# Patient Record
Sex: Male | Born: 1950 | Race: Black or African American | Hispanic: No | Marital: Single
Health system: Southern US, Community
[De-identification: ages and names within clinical notes are randomized; demographics above are authoritative.]

---

## 2010-02-20 ENCOUNTER — Emergency Department (HOSPITAL_COMMUNITY)
Admission: EM | Admit: 2010-02-20 | Discharge: 2010-02-20 | Payer: Self-pay | Source: Home / Self Care | Admitting: Emergency Medicine
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
## 2020-12-02 ENCOUNTER — Emergency Department (HOSPITAL_COMMUNITY): Payer: No Typology Code available for payment source

## 2020-12-02 ENCOUNTER — Other Ambulatory Visit: Payer: Self-pay

## 2020-12-02 ENCOUNTER — Emergency Department (HOSPITAL_COMMUNITY)
Admission: EM | Admit: 2020-12-02 | Discharge: 2020-12-02 | Disposition: A | Discharge status: Home / Self Care | Payer: No Typology Code available for payment source | Attending: Emergency Medicine | Admitting: Emergency Medicine

## 2020-12-02 DIAGNOSIS — R1012 Left upper quadrant pain: Secondary | ICD-10-CM | POA: Insufficient documentation

## 2020-12-02 DIAGNOSIS — Z951 Presence of aortocoronary bypass graft: Secondary | ICD-10-CM | POA: Insufficient documentation

## 2020-12-02 DIAGNOSIS — R0602 Shortness of breath: Secondary | ICD-10-CM | POA: Diagnosis not present

## 2020-12-02 LAB — COMPREHENSIVE METABOLIC PANEL
ALT: 14 U/L (ref 0–44)
AST: 23 U/L (ref 15–41)
Albumin: 2.7 g/dL — ABNORMAL LOW (ref 3.5–5.0)
Alkaline Phosphatase: 63 U/L (ref 38–126)
Anion gap: 7 (ref 5–15)
BUN: 10 mg/dL (ref 8–23)
CO2: 21 mmol/L — ABNORMAL LOW (ref 22–32)
Calcium: 8 mg/dL — ABNORMAL LOW (ref 8.9–10.3)
Chloride: 104 mmol/L (ref 98–111)
Creatinine, Ser: 0.68 mg/dL (ref 0.61–1.24)
GFR, Estimated: >60 mL/min (ref >60)
Glucose, Bld: 173 mg/dL — ABNORMAL HIGH (ref 70–99)
Potassium: 4.1 mmol/L (ref 3.5–5.1)
Sodium: 132 mmol/L — ABNORMAL LOW (ref 135–145)
Total Bilirubin: 0.5 mg/dL (ref 0.3–1.2)
Total Protein: 5.8 g/dL — ABNORMAL LOW (ref 6.5–8.1)

## 2020-12-02 LAB — CBC WITH DIFFERENTIAL/PLATELET
Abs Immature Granulocytes: 0.11 10*3/uL — ABNORMAL HIGH (ref 0.00–0.07)
Basophils Absolute: 0.1 10*3/uL (ref 0.0–0.1)
Basophils Relative: 1 %
Eosinophils Absolute: 0.1 10*3/uL (ref 0.0–0.5)
Eosinophils Relative: 1 %
HCT: 30.4 % — ABNORMAL LOW (ref 39.0–52.0)
Hemoglobin: 9.8 g/dL — ABNORMAL LOW (ref 13.0–17.0)
Immature Granulocytes: 2 %
Lymphocytes Relative: 30 %
Lymphs Abs: 2.2 10*3/uL (ref 0.7–4.0)
MCH: 28.8 pg (ref 26.0–34.0)
MCHC: 32.2 g/dL (ref 30.0–36.0)
MCV: 89.4 fL (ref 80.0–100.0)
Monocytes Absolute: 0.8 10*3/uL (ref 0.1–1.0)
Monocytes Relative: 11 %
Neutro Abs: 4 10*3/uL (ref 1.7–7.7)
Neutrophils Relative %: 55 %
Platelets: 147 10*3/uL — ABNORMAL LOW (ref 150–400)
RBC: 3.4 MIL/uL — ABNORMAL LOW (ref 4.22–5.81)
RDW: 13.3 % (ref 11.5–15.5)
WBC: 7.2 10*3/uL (ref 4.0–10.5)
nRBC: 0 % (ref 0.0–0.2)

## 2020-12-02 LAB — TYPE AND SCREEN
ABO/RH(D): O POS
Antibody Screen: NEGATIVE

## 2020-12-02 LAB — I-STAT CHEM 8, ED
BUN: 10 mg/dL (ref 8–23)
Calcium, Ion: 1.07 mmol/L — ABNORMAL LOW (ref 1.15–1.40)
Chloride: 102 mmol/L (ref 98–111)
Creatinine, Ser: 0.6 mg/dL — ABNORMAL LOW (ref 0.61–1.24)
Glucose, Bld: 168 mg/dL — ABNORMAL HIGH (ref 70–99)
HCT: 31 % — ABNORMAL LOW (ref 39.0–52.0)
Hemoglobin: 10.5 g/dL — ABNORMAL LOW (ref 13.0–17.0)
Potassium: 4.2 mmol/L (ref 3.5–5.1)
Sodium: 135 mmol/L (ref 135–145)
TCO2: 25 mmol/L (ref 22–32)

## 2020-12-02 LAB — TROPONIN I (HIGH SENSITIVITY): Troponin I (High Sensitivity): 61 ng/L — ABNORMAL HIGH (ref <18)

## 2020-12-02 NOTE — ED Notes (Signed)
Pt. Ambulated with pulse ox and was able to maintain a reading greater than or equal to 98%. No SOB reported. Notified Madilyn Hook MD.

## 2020-12-02 NOTE — ED Provider Notes (Signed)
[facility] EMERGENCY DEPARTMENT Provider Note   CSN: [id number] Arrival date & time: 12/02/20  5102     History Chief Complaint  Patient presents with   Shortness of Breath    Glenn James is a 70 y.o. male.  The history is provided by the patient, the EMS personnel and medical records.  Shortness of Breath Glenn James is a 70 y.o. male who presents to the Emergency Department complaining of short of breath. He presents the emergency department by EMS for evaluation of shortness of breath. He is status post CABG at [facility]. He was discharged home on Friday. He states that he was doing well with his exercises following hospital discharge. He states that around 10 PM last night he abruptly became short of breath with left upper quadrant abdominal pain. At the time he was laying flat in bed. He attempted to adjust the pillows to see if this would help him feel better and it did not. He is compliant with his medication since hospital discharge. No fevers, nausea, vomiting. He is having clear watery stools. He states that he has not eaten anything since the procedure. No prior similar symptoms. No reports of bleeding.    No past medical history on file. Seizure CAD CABG CHF There are no problems to display for this patient.        No family history on file.     Home Medications Prior to Admission medications   Not on File    Allergies    Depakote [divalproex sodium] and Metformin and related  Review of Systems   Review of Systems  Respiratory:  Positive for shortness of breath.   All other systems reviewed and are negative.  Physical Exam Updated Vital Signs BP 139/88   Pulse (!) 101   Temp 98.1 F (36.7 C) (Oral)   Resp (!) 25   Ht 5\' 11"  (1.803 m)   Wt 73.5 kg   SpO2 98%   BMI 22.59 kg/m   Physical Exam Vitals and nursing note reviewed.  Constitutional:      Appearance: He is well-developed.  HENT:     Head:  Normocephalic and atraumatic.  Cardiovascular:     Rate and Rhythm: Normal rate and regular rhythm.     Heart sounds: No murmur heard. Pulmonary:     Effort: Pulmonary effort is normal. No respiratory distress.     Breath sounds: Normal breath sounds.     Comments: Midline surgical incision site is clean, dry and intact. Abdominal:     Palpations: Abdomen is soft.     Tenderness: There is no guarding or rebound.     Comments: Mild left upper quadrant tenderness  Musculoskeletal:        General: No swelling or tenderness.  Skin:    General: Skin is warm and dry.  Neurological:     Mental Status: He is alert and oriented to person, place, and time.  Psychiatric:        Behavior: Behavior normal.    ED Results / Procedures / Treatments   Labs (all labs ordered are listed, but only abnormal results are displayed) Labs Reviewed  COMPREHENSIVE METABOLIC PANEL - Abnormal; Notable for the following components:      Result Value   Sodium 132 (*)    CO2 21 (*)    Glucose, Bld 173 (*)    Calcium 8.0 (*)    Total Protein 5.8 (*)    Albumin 2.7 (*)  All other components within normal limits  CBC WITH DIFFERENTIAL/PLATELET - Abnormal; Notable for the following components:   RBC 3.40 (*)    Hemoglobin 9.8 (*)    HCT 30.4 (*)    Platelets 147 (*)    Abs Immature Granulocytes 0.11 (*)    All other components within normal limits  I-STAT CHEM 8, ED - Abnormal; Notable for the following components:   Creatinine, Ser 0.60 (*)    Glucose, Bld 168 (*)    Calcium, Ion 1.07 (*)    Hemoglobin 10.5 (*)    HCT 31.0 (*)    All other components within normal limits  TROPONIN I (HIGH SENSITIVITY) - Abnormal; Notable for the following components:   Troponin I (High Sensitivity) 61 (*)    All other components within normal limits  TYPE AND SCREEN  ABO/RH    EKG EKG Interpretation  Date/Time:  Sunday December 02 2020 05:43:13 EDT Ventricular Rate:  92 PR Interval:  161 QRS  Duration: 128 QT Interval:  397 QTC Calculation: 492 R Axis:   95 Text Interpretation: Sinus rhythm Left bundle branch block Confirmed by Tilden Fossa [phone]) on 12/02/2020 6:08:46 AM  Radiology DG Chest Port 1 View  Result Date: 12/02/2020 CLINICAL DATA:  Short of breath. EXAM: PORTABLE CHEST 1 VIEW COMPARISON:  None. FINDINGS: Previous median sternotomy and CABG procedure. Decreased lung volumes there are bilateral pleural effusions with veil like opacification of the lower lung zones. Subsegmental atelectasis suspected within both lung bases. IMPRESSION: 1. Bilateral pleural effusions. 2. Suspect bibasilar atelectasis. Electronically Signed   By: Signa Kell M.D.   On: 12/02/2020 06:36    Procedures Procedures   Medications Ordered in ED Medications - No data to display  ED Course  I have reviewed the triage vital signs and the nursing notes.  Pertinent labs & imaging results that were available during my care of the patient were reviewed by me and considered in my medical decision making (see chart for details).    MDM Rules/Calculators/A&P                          Pt here for evaluation of sob that started overnight.  He is s/p CABG on 9/14 at [facility].  Records reviewed in Epic.  D/c Hgb 10.  CXR with bilateral pleural effusions and EKG with LVH, intraventricular conduction delay.  On assessment patient states his SOB has resolved, has mild LUQ discomfort.  Troponin is mildly elevated at 61, which is expected following open heart surgery.  No evidence of major hemorrhage or decompensated CHF.  No evidence of post op infection.  Doubt PE.  Pt is able to ambulate without sob or hypoxia in the department.  Plan to d/c home with outpatient follow up and return precautions.     Final Clinical Impression(s) / ED Diagnoses Final diagnoses:  Shortness of breath    Rx / DC Orders ED Discharge Orders     None        Tilden Fossa, MD 12/02/20 [phone]

## 2020-12-02 NOTE — ED Notes (Signed)
Patient's cardiac care is done at [facility].

## 2020-12-02 NOTE — Discharge Instructions (Addendum)
Please call your surgeon and family doctor to let them know you were seen in the Emergency Department.  Continue your current medications as prescribed.   Get rechecked if you develop new or worsening symptoms.

## 2020-12-02 NOTE — ED Triage Notes (Signed)
Pt brought in by EMS for shortness of breath and chest pain. Pt had recent quadruple bypass heart surgery recently.

## 2020-12-02 NOTE — ED Notes (Signed)
MR have been requested from [facility].

## 2021-02-06 ENCOUNTER — Emergency Department (HOSPITAL_COMMUNITY)
Admission: EM | Admit: 2021-02-06 | Discharge: 2021-02-06 | Disposition: A | Discharge status: Home / Self Care | Payer: No Typology Code available for payment source | Attending: Emergency Medicine | Admitting: Emergency Medicine

## 2021-02-06 ENCOUNTER — Emergency Department (HOSPITAL_COMMUNITY): Payer: No Typology Code available for payment source

## 2021-02-06 ENCOUNTER — Other Ambulatory Visit: Payer: Self-pay

## 2021-02-06 DIAGNOSIS — R0602 Shortness of breath: Secondary | ICD-10-CM | POA: Insufficient documentation

## 2021-02-06 DIAGNOSIS — Z5321 Procedure and treatment not carried out due to patient leaving prior to being seen by health care provider: Secondary | ICD-10-CM | POA: Insufficient documentation

## 2021-02-06 DIAGNOSIS — R110 Nausea: Secondary | ICD-10-CM | POA: Diagnosis not present

## 2021-02-06 DIAGNOSIS — R2243 Localized swelling, mass and lump, lower limb, bilateral: Secondary | ICD-10-CM | POA: Diagnosis not present

## 2021-02-06 LAB — COMPREHENSIVE METABOLIC PANEL
ALT: 10 U/L (ref 0–44)
AST: 18 U/L (ref 15–41)
Albumin: 3.2 g/dL — ABNORMAL LOW (ref 3.5–5.0)
Alkaline Phosphatase: 90 U/L (ref 38–126)
Anion gap: 13 (ref 5–15)
BUN: 13 mg/dL (ref 8–23)
CO2: 20 mmol/L — ABNORMAL LOW (ref 22–32)
Calcium: 8.8 mg/dL — ABNORMAL LOW (ref 8.9–10.3)
Chloride: 102 mmol/L (ref 98–111)
Creatinine, Ser: 0.83 mg/dL (ref 0.61–1.24)
GFR, Estimated: >60 mL/min (ref >60)
Glucose, Bld: 119 mg/dL — ABNORMAL HIGH (ref 70–99)
Potassium: 3.8 mmol/L (ref 3.5–5.1)
Sodium: 135 mmol/L (ref 135–145)
Total Bilirubin: 1 mg/dL (ref 0.3–1.2)
Total Protein: 6.7 g/dL (ref 6.5–8.1)

## 2021-02-06 LAB — TROPONIN I (HIGH SENSITIVITY): Troponin I (High Sensitivity): 31 ng/L — ABNORMAL HIGH (ref <18)

## 2021-02-06 LAB — LIPASE, BLOOD: Lipase: 19 U/L (ref 11–51)

## 2021-02-06 NOTE — ED Provider Notes (Signed)
Emergency Medicine Provider Triage Evaluation Note  Glenn James , a 70 y.o. male  was evaluated in triage.  Pt complains of CABG x4 in September.  Since then he has been had more more shortness of breath than normal.  Since then see me for swelling in his ankles bilaterally and has had some increase shortness of breath.  The patient reports he has some nausea, increased shortness of breath, and generalized abdominal pain whenever he lays down.  Has dyspnea on exertion feels like he needs to catch his breath.  Denies any chest pain.  HR104 BP 118/84 CBG 104  Review of Systems  Positive: SOB, nausea, abdominal pain Negative: Chest pain, weakness  Physical Exam  There were no vitals taken for this visit. Gen:   Awake, no distress,  Resp:  Normal effort, speaking full sentences but needs to take breaths in between. Is on 2L for comfort.  MSK:   Moves extremities without difficulty, bilateral pitting edema to lower extremities. Other:  No acute distress.  Patient has pulses intact bilaterally in upper and lower extremities.  Abdomen soft with generalized tenderness.  Medical Decision Making  Medically screening exam initiated at 9:33 AM.  Appropriate orders placed.  Hershel Corkery was informed that the remainder of the evaluation will be completed by another provider, this initial triage assessment does not replace that evaluation, and the importance of remaining in the ED until their evaluation is complete.  Labs and imagining placed.    Achille Rich, PA-C 02/06/21 [id number]    Blane Ohara, MD 02/07/21 1003

## 2021-02-06 NOTE — ED Notes (Signed)
Patient leaving stated going to another place.

## 2021-02-06 NOTE — ED Triage Notes (Signed)
Per [facility] pt reports shortness of breath, nausea when laying down and bilateral ankle swelling since thanksgiving. No relief from Zofran this am.

## 2022-12-23 IMAGING — CR DG CHEST 2V
2 series · 2 of 2 positions shown · non-contrast
Comparison: Radiograph 12/02/2020

CLINICAL DATA: Shortness of breath

EXAM:
CHEST - 2 VIEW

[chest pa]
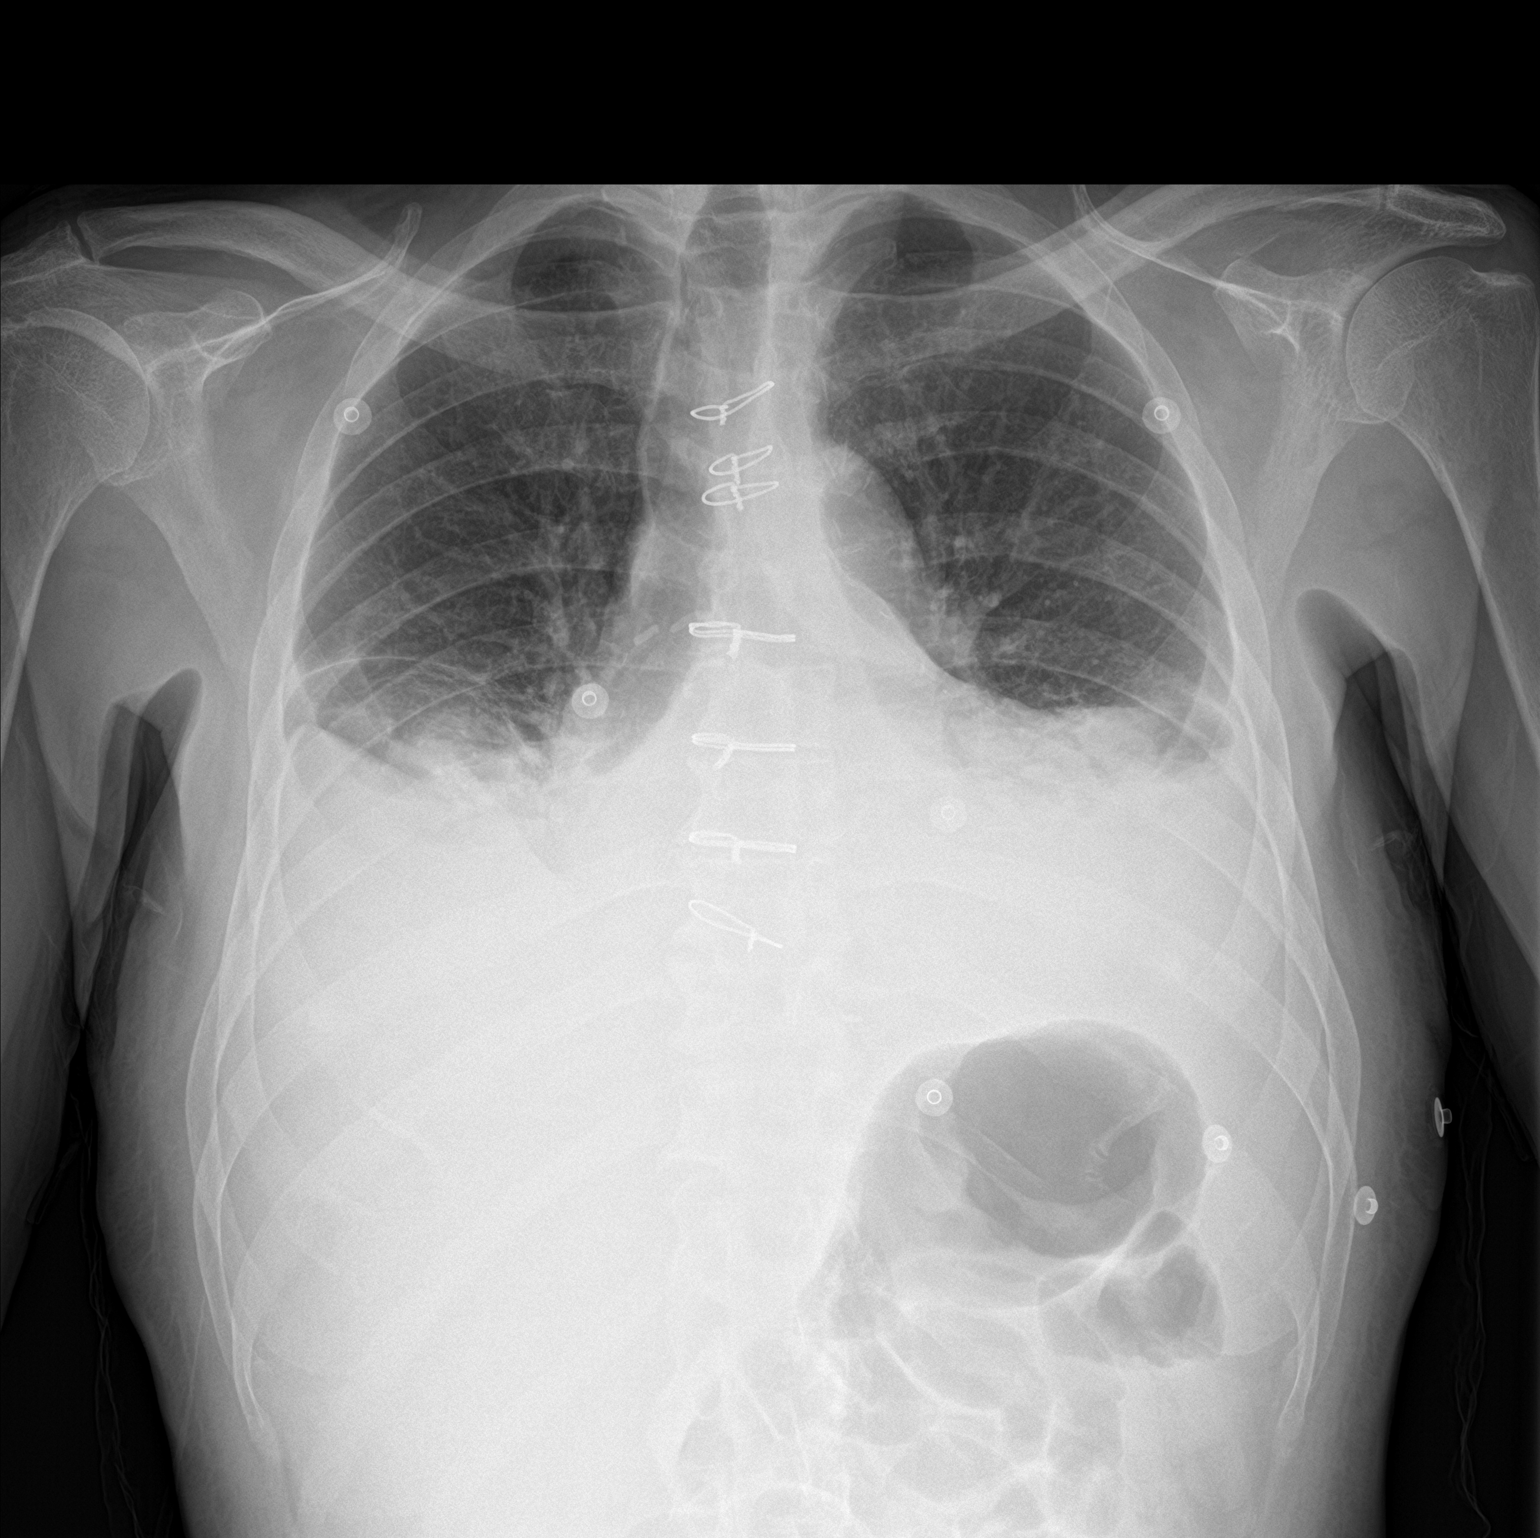

[chest lat]
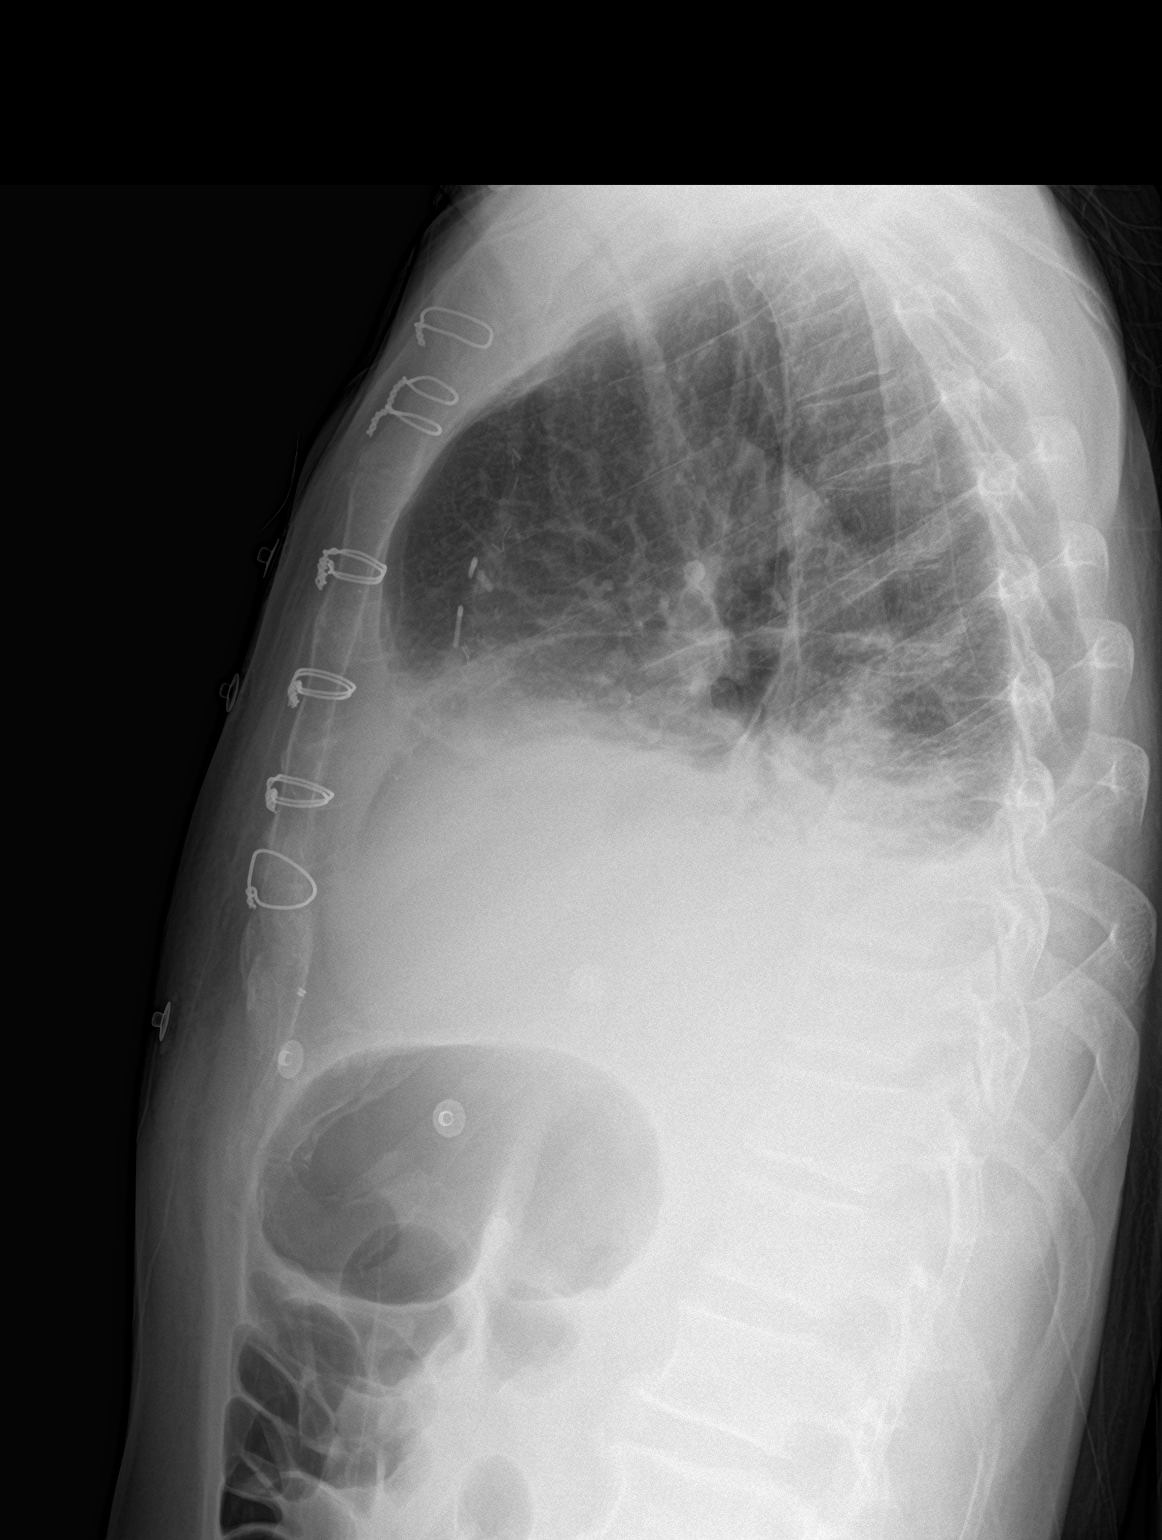

[2 of 2 positions shown; findings below may reference images not displayed]

FINDINGS: Prior median sternotomy. Obscured cardiac silhouette. Redistribution
of moderate size bilateral pleural effusions with adjacent dense
bibasilar consolidations. Thickening of the minor fissure. No
visible pneumothorax. No acute osseous abnormality.
IMPRESSION: Moderate-sized bilateral pleural effusions with adjacent dense
basilar consolidations, favoring atelectasis. Superimposed infection
cannot be excluded.
